# Patient Record
Sex: Female | Born: 1960 | Race: White | Hispanic: No | Marital: Married | Smoking: Current every day smoker
Health system: Southern US, Community
[De-identification: ages and names within clinical notes are randomized; demographics above are authoritative.]

## PROBLEM LIST (undated history)

## (undated) DIAGNOSIS — F419 Anxiety disorder, unspecified: Secondary | ICD-10-CM

## (undated) DIAGNOSIS — M1990 Unspecified osteoarthritis, unspecified site: Secondary | ICD-10-CM

## (undated) DIAGNOSIS — I10 Essential (primary) hypertension: Secondary | ICD-10-CM

## (undated) DIAGNOSIS — C801 Malignant (primary) neoplasm, unspecified: Secondary | ICD-10-CM

## (undated) DIAGNOSIS — F32A Depression, unspecified: Secondary | ICD-10-CM

## (undated) DIAGNOSIS — R112 Nausea with vomiting, unspecified: Secondary | ICD-10-CM

---

## 1998-01-20 HISTORY — PX: COLON SURGERY: SHX602

## 1998-01-20 HISTORY — PX: BREAST SURGERY: SHX581

## 2002-07-04 ENCOUNTER — Ambulatory Visit (HOSPITAL_BASED_OUTPATIENT_CLINIC_OR_DEPARTMENT_OTHER): Admission: RE | Admit: 2002-07-04 | Discharge: 2002-07-05 | Payer: Self-pay | Admitting: Specialist

## 2002-07-04 ENCOUNTER — Encounter (INDEPENDENT_AMBULATORY_CARE_PROVIDER_SITE_OTHER): Payer: Self-pay | Admitting: *Deleted

## 2003-01-21 HISTORY — PX: CHOLECYSTECTOMY: SHX55

## 2003-12-27 ENCOUNTER — Encounter: Admission: RE | Admit: 2003-12-27 | Discharge: 2003-12-27 | Payer: Self-pay | Admitting: *Deleted

## 2003-12-27 ENCOUNTER — Encounter (INDEPENDENT_AMBULATORY_CARE_PROVIDER_SITE_OTHER): Payer: Self-pay | Admitting: *Deleted

## 2004-08-01 ENCOUNTER — Encounter: Admission: RE | Admit: 2004-08-01 | Discharge: 2004-08-01 | Payer: Self-pay | Admitting: *Deleted

## 2005-02-11 ENCOUNTER — Encounter: Admission: RE | Admit: 2005-02-11 | Discharge: 2005-02-11 | Payer: Self-pay | Admitting: *Deleted

## 2005-09-25 ENCOUNTER — Encounter: Admission: RE | Admit: 2005-09-25 | Discharge: 2005-09-25 | Payer: Self-pay | Admitting: Internal Medicine

## 2006-02-17 ENCOUNTER — Encounter: Admission: RE | Admit: 2006-02-17 | Discharge: 2006-02-17 | Payer: Self-pay | Admitting: Internal Medicine

## 2007-03-04 ENCOUNTER — Encounter: Admission: RE | Admit: 2007-03-04 | Discharge: 2007-03-04 | Payer: Self-pay | Admitting: Internal Medicine

## 2008-06-06 ENCOUNTER — Encounter: Admission: RE | Admit: 2008-06-06 | Discharge: 2008-06-06 | Payer: Self-pay | Admitting: *Deleted

## 2009-06-08 ENCOUNTER — Encounter: Admission: RE | Admit: 2009-06-08 | Discharge: 2009-06-08 | Payer: Self-pay | Admitting: Internal Medicine

## 2010-05-30 ENCOUNTER — Other Ambulatory Visit: Payer: Self-pay | Admitting: Family Medicine

## 2010-05-30 DIAGNOSIS — Z1231 Encounter for screening mammogram for malignant neoplasm of breast: Secondary | ICD-10-CM

## 2010-06-07 NOTE — Op Note (Signed)
Kaylee Yang, MAHN NO.:  [id number]   MEDICAL RECORD NO.:  [id number]                   PATIENT TYPE:  AMB   LOCATION:  DSC                                  FACILITY:  [facility]   PHYSICIAN:  Gerald L. Shon Hough, M.D.           DATE OF BIRTH:  May 05, 1960   DATE OF PROCEDURE:  07/04/2002  DATE OF DISCHARGE:                                 OPERATIVE REPORT   SURGEON:  Earvin Hansen L. Shon Hough, M.D.   ASSISTANT:  Alethia Berthold, CFA, CPAO   INDICATIONS FOR PROCEDURE:  A 50 year old lady with severe macromastia, back  and shoulder pain secondary to large, pendulous breasts.  Noncompliant to  conservative treatment with the above symptoms. She has tried medications  and anti-inflammatory agents, talcs, powders, and other creams for  intertriginous changes involving the right and left breast areas with  failure. The patient is now being prepared for bilateral breast reductions.  All procedures and details as well as attendant risks were explained to the  patient preoperatively. The patient understands and consents to surgery.   DESCRIPTION OF PROCEDURE:  The patient was taken to the operating room and  placed on the operating table in the supine position.  Was given adequate  general anesthesia intubated orally.  Preoperatively, the patient was set up  and drawn for the inferior pedicle reduction and mammoplasty.  Remarking the  nipple areolar complexes to 20 cm from the suprasternal notch. After prep  was done with Hibiclens solution and walled off with sterile towels and  drapes so as to make a sterile field, epinephrine solution was injected into  each breast, 500 mL per side.  After waiting the appropriate amount of time  for vasoconstriction to take place, the wounds were scored with #10 blades.  The skin over the inferior pedicle was deepithelialized with #20 blades.  The medial and lateral fatty dermal pedicles were excised down to underlying  fascia.  Hemostasis was maintained with the Bovie unit on coagulation.  Out  laterally more tissue was removed.  The liposuction was fashioned in the  right and left axillary areas over the latissimus dorsi and serratus  anterior areas for removal of more excessive accessory breast tissue.  The  new keyhole area was debulked and flaps were transposed and stayed with 3-0  Prolene. Subcutaneous closure was done with 3-0 Monocryl x2 layers and then  a running subcuticular stitch of 3-0 Monocryl and 5-0 Monocryl throughout  the inverted T. The wounds were drained with #10 Blake drains fully fluted.  They were placed in the depths of the wound and brought out through the  lateral most portion of the incision and secured with 3-0 Prolene. The  wounds were cleansed.  Half inch Steri-Strips and soft dressings were  applied to all of the areas. She withstood the procedures very well and was  taken to the recovery room in excellent condition.  Estimated blood loss was  less than 150 mL.                                               Yaakov Guthrie. Shon Hough, M.D.    Cathie Hoops  D:  07/04/2002  T:  07/04/2002  Job:  [id number]

## 2010-06-21 ENCOUNTER — Ambulatory Visit
Admission: RE | Admit: 2010-06-21 | Discharge: 2010-06-21 | Disposition: A | Discharge status: Home / Self Care | Payer: BC Managed Care – PPO | Source: Ambulatory Visit | Attending: Family Medicine | Admitting: Family Medicine

## 2010-06-21 DIAGNOSIS — Z1231 Encounter for screening mammogram for malignant neoplasm of breast: Secondary | ICD-10-CM

## 2010-09-18 ENCOUNTER — Other Ambulatory Visit: Payer: Self-pay | Admitting: *Deleted

## 2010-09-18 DIAGNOSIS — N63 Unspecified lump in unspecified breast: Secondary | ICD-10-CM

## 2010-09-27 ENCOUNTER — Ambulatory Visit
Admission: RE | Admit: 2010-09-27 | Discharge: 2010-09-27 | Disposition: A | Discharge status: Home / Self Care | Payer: BC Managed Care – PPO | Source: Ambulatory Visit | Attending: *Deleted | Admitting: *Deleted

## 2010-09-27 DIAGNOSIS — N63 Unspecified lump in unspecified breast: Secondary | ICD-10-CM

## 2011-02-21 ENCOUNTER — Other Ambulatory Visit: Payer: Self-pay | Admitting: Family Medicine

## 2011-02-21 DIAGNOSIS — N63 Unspecified lump in unspecified breast: Secondary | ICD-10-CM

## 2011-03-28 ENCOUNTER — Ambulatory Visit
Admission: RE | Admit: 2011-03-28 | Discharge: 2011-03-28 | Disposition: A | Discharge status: Home / Self Care | Payer: BC Managed Care – PPO | Source: Ambulatory Visit | Attending: Family Medicine | Admitting: Family Medicine

## 2011-03-28 DIAGNOSIS — N63 Unspecified lump in unspecified breast: Secondary | ICD-10-CM

## 2011-08-19 ENCOUNTER — Other Ambulatory Visit: Payer: Self-pay | Admitting: Family Medicine

## 2011-08-19 DIAGNOSIS — D249 Benign neoplasm of unspecified breast: Secondary | ICD-10-CM

## 2011-10-10 ENCOUNTER — Other Ambulatory Visit: Payer: BC Managed Care – PPO

## 2011-10-24 ENCOUNTER — Ambulatory Visit
Admission: RE | Admit: 2011-10-24 | Discharge: 2011-10-24 | Disposition: A | Discharge status: Home / Self Care | Payer: BC Managed Care – PPO | Source: Ambulatory Visit | Attending: Family Medicine | Admitting: Family Medicine

## 2011-10-24 DIAGNOSIS — D249 Benign neoplasm of unspecified breast: Secondary | ICD-10-CM

## 2012-05-10 ENCOUNTER — Other Ambulatory Visit: Payer: Self-pay | Admitting: Family Medicine

## 2012-05-10 DIAGNOSIS — N6320 Unspecified lump in the left breast, unspecified quadrant: Secondary | ICD-10-CM

## 2012-11-23 ENCOUNTER — Encounter: Payer: Self-pay | Admitting: Advanced Practice Midwife

## 2012-11-23 VITALS — BP 114/80 | Ht 63.0 in

## 2013-02-10 ENCOUNTER — Other Ambulatory Visit: Payer: Self-pay | Admitting: Family

## 2013-02-10 DIAGNOSIS — N6320 Unspecified lump in the left breast, unspecified quadrant: Secondary | ICD-10-CM

## 2013-02-18 ENCOUNTER — Other Ambulatory Visit: Payer: BC Managed Care – PPO

## 2013-02-25 ENCOUNTER — Ambulatory Visit
Admission: RE | Admit: 2013-02-25 | Discharge: 2013-02-25 | Disposition: A | Discharge status: Home / Self Care | Payer: Commercial Managed Care - PPO | Source: Ambulatory Visit | Attending: Family | Admitting: Family

## 2013-02-25 DIAGNOSIS — N6320 Unspecified lump in the left breast, unspecified quadrant: Secondary | ICD-10-CM

## 2014-01-30 ENCOUNTER — Other Ambulatory Visit: Payer: Self-pay | Admitting: Family

## 2014-01-30 ENCOUNTER — Other Ambulatory Visit: Payer: Self-pay

## 2014-01-30 DIAGNOSIS — Z1231 Encounter for screening mammogram for malignant neoplasm of breast: Secondary | ICD-10-CM

## 2014-03-03 ENCOUNTER — Ambulatory Visit
Admission: RE | Admit: 2014-03-03 | Discharge: 2014-03-03 | Disposition: A | Discharge status: Home / Self Care | Payer: Commercial Managed Care - PPO | Source: Ambulatory Visit

## 2014-03-03 ENCOUNTER — Encounter (INDEPENDENT_AMBULATORY_CARE_PROVIDER_SITE_OTHER): Payer: Self-pay

## 2014-03-03 DIAGNOSIS — Z1231 Encounter for screening mammogram for malignant neoplasm of breast: Secondary | ICD-10-CM

## 2015-05-02 ENCOUNTER — Other Ambulatory Visit: Payer: Self-pay

## 2015-05-02 DIAGNOSIS — Z1231 Encounter for screening mammogram for malignant neoplasm of breast: Secondary | ICD-10-CM

## 2015-05-25 ENCOUNTER — Ambulatory Visit: Payer: Commercial Managed Care - PPO

## 2015-05-25 ENCOUNTER — Ambulatory Visit
Admission: RE | Admit: 2015-05-25 | Discharge: 2015-05-25 | Disposition: A | Discharge status: Home / Self Care | Payer: Commercial Managed Care - PPO | Source: Ambulatory Visit

## 2015-05-25 DIAGNOSIS — Z1231 Encounter for screening mammogram for malignant neoplasm of breast: Secondary | ICD-10-CM

## 2015-05-28 ENCOUNTER — Other Ambulatory Visit: Payer: Self-pay | Admitting: Obstetrics and Gynecology

## 2015-05-28 DIAGNOSIS — N63 Unspecified lump in unspecified breast: Secondary | ICD-10-CM

## 2015-06-01 ENCOUNTER — Ambulatory Visit
Admission: RE | Admit: 2015-06-01 | Discharge: 2015-06-01 | Disposition: A | Discharge status: Home / Self Care | Payer: Commercial Managed Care - PPO | Source: Ambulatory Visit | Attending: Obstetrics and Gynecology | Admitting: Obstetrics and Gynecology

## 2015-06-01 DIAGNOSIS — N63 Unspecified lump in unspecified breast: Secondary | ICD-10-CM

## 2015-10-29 ENCOUNTER — Other Ambulatory Visit: Payer: Self-pay | Admitting: Obstetrics and Gynecology

## 2015-10-29 DIAGNOSIS — N6320 Unspecified lump in the left breast, unspecified quadrant: Secondary | ICD-10-CM

## 2015-12-07 ENCOUNTER — Other Ambulatory Visit: Payer: Commercial Managed Care - PPO

## 2015-12-26 ENCOUNTER — Ambulatory Visit: Payer: Commercial Managed Care - PPO | Admitting: Gastroenterology

## 2016-01-04 ENCOUNTER — Ambulatory Visit
Admission: RE | Admit: 2016-01-04 | Discharge: 2016-01-04 | Disposition: A | Discharge status: Home / Self Care | Payer: Commercial Managed Care - PPO | Source: Ambulatory Visit | Attending: Obstetrics and Gynecology | Admitting: Obstetrics and Gynecology

## 2016-01-04 DIAGNOSIS — N6320 Unspecified lump in the left breast, unspecified quadrant: Secondary | ICD-10-CM

## 2017-12-22 ENCOUNTER — Other Ambulatory Visit: Payer: Self-pay | Admitting: Obstetrics and Gynecology

## 2017-12-22 ENCOUNTER — Other Ambulatory Visit: Payer: Self-pay | Admitting: Family

## 2017-12-22 DIAGNOSIS — Z1231 Encounter for screening mammogram for malignant neoplasm of breast: Secondary | ICD-10-CM

## 2017-12-22 DIAGNOSIS — N6320 Unspecified lump in the left breast, unspecified quadrant: Secondary | ICD-10-CM

## 2018-02-12 ENCOUNTER — Other Ambulatory Visit: Payer: Commercial Managed Care - PPO

## 2018-04-02 ENCOUNTER — Other Ambulatory Visit: Payer: Commercial Managed Care - PPO

## 2019-08-18 ENCOUNTER — Other Ambulatory Visit: Payer: Self-pay | Admitting: Adult Health

## 2019-12-23 ENCOUNTER — Other Ambulatory Visit: Payer: Self-pay | Admitting: Obstetrics and Gynecology

## 2019-12-23 DIAGNOSIS — N63 Unspecified lump in unspecified breast: Secondary | ICD-10-CM

## 2020-01-30 ENCOUNTER — Other Ambulatory Visit: Payer: Self-pay

## 2020-02-23 ENCOUNTER — Other Ambulatory Visit: Payer: Self-pay

## 2020-05-15 ENCOUNTER — Other Ambulatory Visit: Payer: Self-pay

## 2020-08-29 ENCOUNTER — Other Ambulatory Visit: Payer: Self-pay | Admitting: Obstetrics and Gynecology

## 2020-08-29 ENCOUNTER — Ambulatory Visit (HOSPITAL_COMMUNITY)
Admission: RE | Admit: 2020-08-29 | Discharge: 2020-08-29 | Disposition: A | Discharge status: Home / Self Care | Payer: BC Managed Care – PPO | Source: Ambulatory Visit | Attending: Obstetrics and Gynecology | Admitting: Obstetrics and Gynecology

## 2020-08-29 ENCOUNTER — Ambulatory Visit
Admission: RE | Admit: 2020-08-29 | Discharge: 2020-08-29 | Disposition: A | Discharge status: Home / Self Care | Payer: BC Managed Care – PPO | Source: Ambulatory Visit | Attending: Obstetrics and Gynecology | Admitting: Obstetrics and Gynecology

## 2020-08-29 ENCOUNTER — Other Ambulatory Visit: Payer: Self-pay

## 2020-08-29 DIAGNOSIS — N63 Unspecified lump in unspecified breast: Secondary | ICD-10-CM

## 2020-08-29 DIAGNOSIS — R928 Other abnormal and inconclusive findings on diagnostic imaging of breast: Secondary | ICD-10-CM

## 2020-09-06 ENCOUNTER — Ambulatory Visit
Admission: RE | Admit: 2020-09-06 | Discharge: 2020-09-06 | Disposition: A | Discharge status: Home / Self Care | Payer: BC Managed Care – PPO | Source: Ambulatory Visit | Attending: Obstetrics and Gynecology | Admitting: Obstetrics and Gynecology

## 2020-09-06 ENCOUNTER — Other Ambulatory Visit: Payer: Self-pay

## 2020-09-06 DIAGNOSIS — N63 Unspecified lump in unspecified breast: Secondary | ICD-10-CM

## 2020-09-07 ENCOUNTER — Other Ambulatory Visit: Payer: Self-pay | Admitting: Obstetrics and Gynecology

## 2020-09-07 DIAGNOSIS — D242 Benign neoplasm of left breast: Secondary | ICD-10-CM

## 2020-09-14 ENCOUNTER — Ambulatory Visit
Admission: RE | Admit: 2020-09-14 | Discharge: 2020-09-14 | Disposition: A | Discharge status: Home / Self Care | Payer: BC Managed Care – PPO | Source: Ambulatory Visit | Attending: Obstetrics and Gynecology | Admitting: Obstetrics and Gynecology

## 2020-09-14 ENCOUNTER — Other Ambulatory Visit: Payer: Self-pay

## 2020-09-14 DIAGNOSIS — D242 Benign neoplasm of left breast: Secondary | ICD-10-CM

## 2020-09-27 ENCOUNTER — Other Ambulatory Visit: Payer: Self-pay | Admitting: Surgery

## 2020-10-17 ENCOUNTER — Other Ambulatory Visit: Payer: Self-pay

## 2020-10-17 ENCOUNTER — Encounter (HOSPITAL_BASED_OUTPATIENT_CLINIC_OR_DEPARTMENT_OTHER): Payer: Self-pay | Admitting: Surgery

## 2020-10-17 NOTE — Progress Notes (Signed)
Spoke w/ via phone for pre-op interview--- [facility] needs dos----  EKG, ISTAT             Lab results------ COVID test -----patient states asymptomatic no test needed Arrive at ------- NPO after MN NO Solid Food.  Clear liquids from MN until--- Med rec completed Medications to take morning of surgery ----- Prozac Diabetic medication ----- Patient instructed no nail polish to be worn day of surgery Patient instructed to bring photo id and insurance card day of surgery Patient aware to have Driver (ride ) / caregiver    for 24 hours after surgery  Patient Special Instructions ----- Pre-Op special Istructions ----- Patient verbalized understanding of instructions that were given at this phone interview. Patient denies shortness of breath, chest pain, fever, cough at this phone interview.

## 2020-10-23 NOTE — H&P (Signed)
REFERRING PHYSICIAN:  Lovenia Kim, MD   PROVIDER:  Beverlee Nims, MD   MRN: [id number] DOB: January 11, 1961    Subjective    Chief Complaint: Breast Problem (Left Breast )       History of Present Illness: Kaylee Yang is a 60 y.o. female who is seen today as an office consultation at the request of Dr. Ronita Hipps for evaluation of Breast Problem (Left Breast ) .     She is referred here for evaluation of a mass in the left breast.  She has had a mass since at least 2017.  She recently felt has been getting slightly bigger.  She underwent a mammogram and ultrasound showing the mass measuring 1.7 cm.  It was biopsied showing an intraductal papilloma.  She has had previous bilateral breast reduction.  She has a significant family history of breast cancer and a maternal aunt and a cousin.  She has no cardiopulmonary issues and is otherwise been doing well.     Review of Systems: A complete review of systems was obtained from the patient.  I have reviewed this information and discussed as appropriate with the patient.  See HPI as well for other ROS.   ROS      Medical History: Past Medical History      Past Medical History:  Diagnosis Date   Anxiety     Depression     Diverticulitis     Edema      occassional   Hyperlipidemia     Hypertension     Morbid obesity with BMI of 40.0-44.9, adult (CMS-HCC)     Osteoarthritis     PONV (postoperative nausea and vomiting)             Patient Active Problem List  Diagnosis   Obesity   HTN (hypertension)   Morbid obesity with BMI of 40.0-44.9, adult (CMS-HCC)   PONV (postoperative nausea and vomiting)   Osteoarthritis   Hypertension   Hyperlipidemia   Anxiety state   Cervical radiculopathy   Chronic neck pain   Chronic pain syndrome   Degenerative disc disease, cervical   Depressive disorder   Erosive osteoarthritis of both hands   History of fusion of cervical spine   History of gastric surgery   Long-term use  of Plaquenil   Polyarthralgia   Primary osteoarthritis involving multiple joints   Primary osteoarthritis of both knees   Tobacco use disorder   Vitamin D deficiency   Essential hypertension   Hyperlipidemia   Morbid obesity due to excess calories (CMS-HCC)   Arthritis      Past Surgical History       Past Surgical History:  Procedure Laterality Date   ARTHRODESIS ANTERIOR CERVICLE SPINE N/A 07/26/2019    Procedure: Anterior cervical discectomy and fusion Cervical 5-6, C6-7 with Vertigraft VG1 allograft and nuvasive Archon plating;  Surgeon: Redge Gainer, MD;  Location: [place];  Service: Neurosurgery;  Laterality: N/A;   ARTHRODESIS ANTERIOR CERVICLE SPINE N/A 07/26/2019    Procedure: ARTHRODESIS ANT INTERBODY INC DISCECTOMY, CERVICAL EACH ADDL;  Surgeon: Redge Gainer, MD;  Location: [place];  Service: Neurosurgery;  Laterality: N/A;   breast surgery Bilateral      lumpectomy- bilateral,  both negative   BREAST SURGERY       colon resection        COLON SURGERY        diverticulitis   COLONOSCOPY       elbow surgery  INSERTION STRUCTURAL BONE ALLOGRAFT FOR SPINE SURGERY N/A 07/26/2019    Procedure: INSERTION STRUCTURAL BONE ALLOGRAFT FOR SPINE SURGERY;  Surgeon: Redge Gainer, MD;  Location: [place];  Service: Neurosurgery;  Laterality: N/A;   INSTRUMENTATION ANTERIOR SPINE 4 TO 7 SEGMENTS N/A 07/26/2019    Procedure: INSTRUMENTATION ANTERIOR SPINE 2 TO 3 VERTEBRAL SEGMENTS;  Surgeon: Redge Gainer, MD;  Location: [place];  Service: Neurosurgery;  Laterality: N/A;        Allergies      Allergies  Allergen Reactions   Codeine Nausea and Rash              Current Outpatient Medications on File Prior to Visit  Medication Sig Dispense Refill   cyanocobalamin (VITAMIN B12) 1,000 mcg/mL injection Inject 1,000 mcg into the muscle monthly Around the 15th         cyclobenzaprine (FLEXERIL) 10 MG tablet Take 10 mg by mouth 3 (three) times daily as needed for  Muscle spasms       ezetimibe (ZETIA) 10 mg tablet TAKE ONE TABLET BY MOUTH NIGHTLY ALONG WITH SIMVASTATIN       FLUoxetine (PROZAC) 20 MG capsule TAKE THREE CAPSULES EVERY DAY       lisinopril-hydrochlorothiazide (PRINZIDE,ZESTORETIC) 20-25 mg tablet Take 1 tablet by mouth daily.       simvastatin (ZOCOR) 10 MG tablet TAKE ONE TABLET BY MOUTH NIGHTLY ALONG WITH EZETIMIBE        No current facility-administered medications on file prior to visit.      Family History       Family History  Problem Relation Age of Onset   Heart disease Mother     Arthritis Mother     Thyroid disease Mother     Hyperlipidemia (Elevated cholesterol) Mother     Bladder Cancer Father     Heart disease Father     Myocardial Infarction (Heart attack) Father     Diabetes Father          Social History        Tobacco Use  Smoking Status Current Every Day Smoker   Packs/day: 2.00   Years: 38.00   Pack years: 76.00   Types: Cigarettes  Smokeless Tobacco Never Used  Tobacco Comment    1-3 packs per week      Social History  Social History         Socioeconomic History   Marital status: Married  Tobacco Use   Smoking status: Current Every Day Smoker      Packs/day: 2.00      Years: 38.00      Pack years: 76.00      Types: Cigarettes   Smokeless tobacco: Never Used   Tobacco comment: 1-3 packs per week  Vaping Use   Vaping Use: Never used  Substance and Sexual Activity   Alcohol use: Never      Comment: Does not drink alcohol   Drug use: Never        Objective:         Vitals:      BP: 124/82  Pulse: 90  Temp: 36.7 C (98 F)  SpO2: 95%  Weight: (!) 111.1 kg (245 lb)  Height: 158.8 cm (5' 2.5")    Body mass index is 44.1 kg/m.   Physical Exam    She appears well on exam   There is no axillary adenopathy   There is a small mass that is just below the areola at the 6  o'clock position of the left breast.  It is easily palpable.  There are no other  abnormalities.  Lungs clear  CV RRR  Abdomen soft, NT  Skin without erythema     Labs, Imaging and Diagnostic Testing: I reviewed her mammogram, ultrasound, and pathology results   Assessment and Plan:  Diagnoses and all orders for this visit:   Intraductal papilloma of breast, left       I had a long discussion with the patient and her husband regarding the pathology findings.  Again I have reviewed her mammograms, ultrasound, pathology results and I gave her a copy of the pathology report.  Left breast lumpectomy is recommended to completely remove the papilloma for complete histologic evaluation to rule out malignancy.  We discussed the reasons for this in detail.  I discussed the surgical procedure in detail.  We discussed the risk which includes but is not limited to bleeding, infection, injury to surrounding structures, the need for further procedures if malignancy is found, postoperative recovery, etc.  They understand and wish to proceed with surgery which will be scheduled.

## 2020-10-24 ENCOUNTER — Encounter (HOSPITAL_BASED_OUTPATIENT_CLINIC_OR_DEPARTMENT_OTHER)
Admission: RE | Disposition: A | Discharge status: Home / Self Care | Payer: Self-pay | Source: Home / Self Care | Attending: Surgery

## 2020-10-24 ENCOUNTER — Ambulatory Visit (HOSPITAL_BASED_OUTPATIENT_CLINIC_OR_DEPARTMENT_OTHER): Payer: BC Managed Care – PPO | Admitting: Anesthesiology

## 2020-10-24 ENCOUNTER — Ambulatory Visit (HOSPITAL_BASED_OUTPATIENT_CLINIC_OR_DEPARTMENT_OTHER)
Admission: RE | Admit: 2020-10-24 | Discharge: 2020-10-24 | Disposition: A | Discharge status: Home / Self Care | Payer: BC Managed Care – PPO | Attending: Surgery | Admitting: Surgery

## 2020-10-24 ENCOUNTER — Encounter (HOSPITAL_BASED_OUTPATIENT_CLINIC_OR_DEPARTMENT_OTHER): Payer: Self-pay | Admitting: Surgery

## 2020-10-24 ENCOUNTER — Other Ambulatory Visit: Payer: Self-pay

## 2020-10-24 DIAGNOSIS — Z8052 Family history of malignant neoplasm of bladder: Secondary | ICD-10-CM | POA: Diagnosis not present

## 2020-10-24 DIAGNOSIS — Z885 Allergy status to narcotic agent status: Secondary | ICD-10-CM | POA: Insufficient documentation

## 2020-10-24 DIAGNOSIS — Z8249 Family history of ischemic heart disease and other diseases of the circulatory system: Secondary | ICD-10-CM | POA: Insufficient documentation

## 2020-10-24 DIAGNOSIS — Z8349 Family history of other endocrine, nutritional and metabolic diseases: Secondary | ICD-10-CM | POA: Diagnosis not present

## 2020-10-24 DIAGNOSIS — Z79899 Other long term (current) drug therapy: Secondary | ICD-10-CM | POA: Diagnosis not present

## 2020-10-24 DIAGNOSIS — D242 Benign neoplasm of left breast: Secondary | ICD-10-CM | POA: Diagnosis not present

## 2020-10-24 DIAGNOSIS — Z6841 Body Mass Index (BMI) 40.0 and over, adult: Secondary | ICD-10-CM | POA: Diagnosis not present

## 2020-10-24 DIAGNOSIS — F17210 Nicotine dependence, cigarettes, uncomplicated: Secondary | ICD-10-CM | POA: Diagnosis not present

## 2020-10-24 DIAGNOSIS — Z833 Family history of diabetes mellitus: Secondary | ICD-10-CM | POA: Diagnosis not present

## 2020-10-24 DIAGNOSIS — Z803 Family history of malignant neoplasm of breast: Secondary | ICD-10-CM | POA: Diagnosis not present

## 2020-10-24 HISTORY — DX: Essential (primary) hypertension: I10

## 2020-10-24 HISTORY — DX: Malignant (primary) neoplasm, unspecified: C80.1

## 2020-10-24 HISTORY — PX: BREAST LUMPECTOMY: SHX2

## 2020-10-24 HISTORY — DX: Unspecified osteoarthritis, unspecified site: M19.90

## 2020-10-24 HISTORY — DX: Anxiety disorder, unspecified: F41.9

## 2020-10-24 HISTORY — DX: Nausea with vomiting, unspecified: R11.2

## 2020-10-24 HISTORY — DX: Depression, unspecified: F32.A

## 2020-10-24 LAB — POCT I-STAT, CHEM 8
BUN: 18 mg/dL (ref 6–20)
Calcium, Ion: 1.26 mmol/L (ref 1.15–1.40)
Chloride: 105 mmol/L (ref 98–111)
Creatinine, Ser: 0.7 mg/dL (ref 0.44–1.00)
Glucose, Bld: 92 mg/dL (ref 70–99)
HCT: 38 % (ref 36.0–46.0)
Hemoglobin: 12.9 g/dL (ref 12.0–15.0)
Potassium: 3.9 mmol/L (ref 3.5–5.1)
Sodium: 142 mmol/L (ref 135–145)
TCO2: 26 mmol/L (ref 22–32)

## 2020-10-24 SURGERY — BREAST LUMPECTOMY
Anesthesia: General | Site: Breast | Laterality: Left

## 2020-10-24 MED ORDER — CEFAZOLIN SODIUM-DEXTROSE 2-4 GM/100ML-% IV SOLN
INTRAVENOUS | Status: AC
  Filled 2020-10-24: qty 100

## 2020-10-24 MED ORDER — TRAMADOL HCL 50 MG PO TABS: 50.0000 mg | ORAL_TABLET | Freq: Four times a day (QID) | ORAL | 0 refills | Status: AC | PRN

## 2020-10-24 MED ORDER — LIDOCAINE 2% (20 MG/ML) 5 ML SYRINGE
INTRAMUSCULAR | Status: AC
  Filled 2020-10-24: qty 5

## 2020-10-24 MED ORDER — BUPIVACAINE-EPINEPHRINE 0.5% -1:200000 IJ SOLN
INTRAMUSCULAR | Status: DC | PRN
  Administered 2020-10-24: 10 mL

## 2020-10-24 MED ORDER — FENTANYL CITRATE (PF) 100 MCG/2ML IJ SOLN
INTRAMUSCULAR | Status: AC
  Filled 2020-10-24: qty 2

## 2020-10-24 MED ORDER — FENTANYL CITRATE (PF) 100 MCG/2ML IJ SOLN: 25.0000 ug | INTRAMUSCULAR | Status: DC | PRN

## 2020-10-24 MED ORDER — CHLORHEXIDINE GLUCONATE CLOTH 2 % EX PADS: 6.0000 | MEDICATED_PAD | Freq: Once | CUTANEOUS | Status: DC

## 2020-10-24 MED ORDER — PROPOFOL 10 MG/ML IV BOLUS
INTRAVENOUS | Status: DC | PRN
Start: 2020-10-24 — End: 2020-10-24
  Administered 2020-10-24: 200 mg via INTRAVENOUS

## 2020-10-24 MED ORDER — PROPOFOL 10 MG/ML IV BOLUS
INTRAVENOUS | Status: AC
  Filled 2020-10-24: qty 20

## 2020-10-24 MED ORDER — MIDAZOLAM HCL 5 MG/5ML IJ SOLN
INTRAMUSCULAR | Status: DC | PRN
  Administered 2020-10-24: 2 mg via INTRAVENOUS

## 2020-10-24 MED ORDER — DEXAMETHASONE SODIUM PHOSPHATE 10 MG/ML IJ SOLN
INTRAMUSCULAR | Status: AC
  Filled 2020-10-24: qty 1

## 2020-10-24 MED ORDER — CEFAZOLIN SODIUM-DEXTROSE 2-4 GM/100ML-% IV SOLN
2.0000 g | INTRAVENOUS | Status: AC
  Administered 2020-10-24: 2 g via INTRAVENOUS

## 2020-10-24 MED ORDER — KETOROLAC TROMETHAMINE 30 MG/ML IJ SOLN: 30.0000 mg | Freq: Once | INTRAMUSCULAR | Status: DC | PRN

## 2020-10-24 MED ORDER — MIDAZOLAM HCL 2 MG/2ML IJ SOLN
INTRAMUSCULAR | Status: AC
  Filled 2020-10-24: qty 2

## 2020-10-24 MED ORDER — DEXAMETHASONE SODIUM PHOSPHATE 4 MG/ML IJ SOLN
INTRAMUSCULAR | Status: DC | PRN
  Administered 2020-10-24: 10 mg via INTRAVENOUS

## 2020-10-24 MED ORDER — ONDANSETRON HCL 4 MG/2ML IJ SOLN
INTRAMUSCULAR | Status: AC
  Filled 2020-10-24: qty 2

## 2020-10-24 MED ORDER — LIDOCAINE 2% (20 MG/ML) 5 ML SYRINGE
INTRAMUSCULAR | Status: DC | PRN
  Administered 2020-10-24: 100 mg via INTRAVENOUS

## 2020-10-24 MED ORDER — LACTATED RINGERS IV SOLN: INTRAVENOUS | Status: DC

## 2020-10-24 MED ORDER — KETOROLAC TROMETHAMINE 30 MG/ML IJ SOLN
INTRAMUSCULAR | Status: AC
  Filled 2020-10-24: qty 1

## 2020-10-24 MED ORDER — FENTANYL CITRATE (PF) 100 MCG/2ML IJ SOLN
INTRAMUSCULAR | Status: DC | PRN
  Administered 2020-10-24 (×2): 50 ug via INTRAVENOUS

## 2020-10-24 MED ORDER — ONDANSETRON HCL 4 MG/2ML IJ SOLN: 4.0000 mg | Freq: Once | INTRAMUSCULAR | Status: DC | PRN

## 2020-10-24 MED ORDER — ONDANSETRON HCL 4 MG/2ML IJ SOLN
INTRAMUSCULAR | Status: DC | PRN
  Administered 2020-10-24: 4 mg via INTRAVENOUS

## 2020-10-24 MED ORDER — 0.9 % SODIUM CHLORIDE (POUR BTL) OPTIME
TOPICAL | Status: DC | PRN
Start: 2020-10-24 — End: 2020-10-24
  Administered 2020-10-24: 500 mL

## 2020-10-24 SURGICAL SUPPLY — 34 items
ADH SKN CLS APL DERMABOND .7 (GAUZE/BANDAGES/DRESSINGS) ×1
APL PRP STRL LF DISP 70% ISPRP (MISCELLANEOUS) ×1
BLADE SURG 15 STRL LF DISP TIS (BLADE) ×1 IMPLANT
BLADE SURG 15 STRL SS (BLADE) ×2
CHLORAPREP W/TINT 26 (MISCELLANEOUS) ×2 IMPLANT
COVER BACK TABLE 60X90IN (DRAPES) ×2 IMPLANT
COVER MAYO STAND STRL (DRAPES) ×2 IMPLANT
DERMABOND ADVANCED (GAUZE/BANDAGES/DRESSINGS) ×1
DERMABOND ADVANCED .7 DNX12 (GAUZE/BANDAGES/DRESSINGS) ×1 IMPLANT
DRAPE LAPAROTOMY 100X72 PEDS (DRAPES) ×2 IMPLANT
DRAPE UTILITY XL STRL (DRAPES) ×2 IMPLANT
ELECT REM PT RETURN 9FT ADLT (ELECTROSURGICAL) ×2
ELECTRODE REM PT RTRN 9FT ADLT (ELECTROSURGICAL) ×1 IMPLANT
GLOVE SURG POLYISO LF SZ7 (GLOVE) ×1 IMPLANT
GLOVE SURG SIGNA 7.5 PF LTX (GLOVE) ×2 IMPLANT
GLOVE SURG UNDER POLY LF SZ7 (GLOVE) ×2 IMPLANT
GLOVE SURG UNDER POLY LF SZ7.5 (GLOVE) ×1 IMPLANT
GOWN STRL REUS W/ TWL LRG LVL3 (GOWN DISPOSABLE) ×1 IMPLANT
GOWN STRL REUS W/ TWL XL LVL3 (GOWN DISPOSABLE) ×1 IMPLANT
GOWN STRL REUS W/TWL LRG LVL3 (GOWN DISPOSABLE) ×3 IMPLANT
GOWN STRL REUS W/TWL XL LVL3 (GOWN DISPOSABLE) ×2
NDL HYPO 25X1 1.5 SAFETY (NEEDLE) ×1 IMPLANT
NEEDLE HYPO 25X1 1.5 SAFETY (NEEDLE) ×2 IMPLANT
NS IRRIG 500ML POUR BTL (IV SOLUTION) ×1 IMPLANT
PACK BASIN DAY SURGERY FS (CUSTOM PROCEDURE TRAY) ×2 IMPLANT
PENCIL SMOKE EVACUATOR (MISCELLANEOUS) ×2 IMPLANT
SLEEVE SCD COMPRESS KNEE MED (STOCKING) ×1 IMPLANT
SPONGE T-LAP 4X18 ~~LOC~~+RFID (SPONGE) ×2 IMPLANT
SUT MNCRL AB 4-0 PS2 18 (SUTURE) ×2 IMPLANT
SUT SILK 2 0 SH (SUTURE) ×2 IMPLANT
SUT VIC AB 3-0 SH 27 (SUTURE) ×2
SUT VIC AB 3-0 SH 27X BRD (SUTURE) ×1 IMPLANT
SYR CONTROL 10ML LL (SYRINGE) ×2 IMPLANT
TOWEL OR 17X26 10 PK STRL BLUE (TOWEL DISPOSABLE) ×2 IMPLANT

## 2020-10-24 NOTE — Op Note (Signed)
LEFT BREAST LUMPECTOMY  Procedure Note  Kaylee Yang 10/24/2020   Pre-op Diagnosis: LEFT BREAST PAPILLOMA     Post-op Diagnosis: same  Procedure(s): LEFT BREAST LUMPECTOMY  Surgeon(s): Coralie Keens, MD  Anesthesia: General  Staff:  Circulator: [place] Letta Pate, RN Scrub Person: Jason Fila, Lannette Donath I  Estimated Blood Loss: Minimal               Specimens: sent to path  Indications: This is a 60 year old female was found to have a small mass that was subareolar in the left breast.  She underwent a stereotactic biopsy showing intraductal papilloma.  The decision was made to proceed with a left breast lumpectomy  Procedure: Patient was brought to operating identifies correct patient.  She is placed upon the operating table general anesthesia was induced.  Her left breast was prepped and draped in usual sterile fashion.  The palpable mass was located beneath the areola at the 6 clock position.  I anesthetized the skin of the lower edge of the areola with Marcaine and then made a circumareolar incision through previous scar with a scalpel.  I then dissected down to the breast tissue with electrocautery.  The mass was superficial and easily palpable.  I excised it in its entirety and sent to pathology for evaluation.  I anesthetized the incision further with Marcaine.  I achieved hemostasis with the cautery.  I then closed the subcutaneous tissue with interrupted 3-0 Vicryl sutures and closed the skin with a running 4-0 Monocryl.  Dermabond was then applied.  The patient tolerated the procedure well.  All the counts were correct at the end of the procedure.  The patient was then extubated in the operating room and taken in a stable condition to the recovery room.          Coralie Keens   Date: 10/24/2020  Time: 8:53 AM

## 2020-10-24 NOTE — Anesthesia Procedure Notes (Signed)
Procedure Name: LMA Insertion Date/Time: 10/24/2020 8:31 AM Performed by: Justice Rocher, CRNA Pre-anesthesia Checklist: Patient identified, Emergency Drugs available, Suction available, Patient being monitored and Timeout performed Patient Re-evaluated:Patient Re-evaluated prior to induction Oxygen Delivery Method: Circle system utilized Preoxygenation: Pre-oxygenation with 100% oxygen Induction Type: IV induction Ventilation: Mask ventilation without difficulty LMA: LMA inserted LMA Size: 4.0 Number of attempts: 1 Airway Equipment and Method: Bite block Placement Confirmation: positive ETCO2, breath sounds checked- equal and bilateral and CO2 detector Tube secured with: Tape Dental Injury: Teeth and Oropharynx as per pre-operative assessment

## 2020-10-24 NOTE — Anesthesia Postprocedure Evaluation (Signed)
Anesthesia Post Note  Patient: Kaylee Yang  Procedure(s) Performed: LEFT BREAST LUMPECTOMY (Left: Breast)     Patient location during evaluation: PACU Anesthesia Type: General Level of consciousness: awake and alert Pain management: pain level controlled Vital Signs Assessment: post-procedure vital signs reviewed and stable Respiratory status: spontaneous breathing, nonlabored ventilation, respiratory function stable and patient connected to nasal cannula oxygen Cardiovascular status: blood pressure returned to baseline and stable Postop Assessment: no apparent nausea or vomiting Anesthetic complications: no   No notable events documented.  Last Vitals:  Vitals:   10/24/20 0910 10/24/20 0915  BP:  127/68  Pulse: 76 69  Resp: 13 18  Temp:    SpO2: 100% 97%    Last Pain:  Vitals:   10/24/20 0905  TempSrc:   PainSc: 0-No pain                 Zeplin Aleshire S

## 2020-10-24 NOTE — Discharge Instructions (Addendum)
[place] Office Phone Number [phone]  BREAST BIOPSY/ PARTIAL MASTECTOMY: POST OP INSTRUCTIONS  Always review your discharge instruction sheet given to you by the facility where your surgery was performed.  IF YOU HAVE DISABILITY OR FAMILY LEAVE FORMS, YOU MUST BRING THEM TO THE OFFICE FOR PROCESSING.  DO NOT GIVE THEM TO YOUR DOCTOR.  A prescription for pain medication may be given to you upon discharge.  Take your pain medication as prescribed, if needed.  If narcotic pain medicine is not needed, then you may take acetaminophen (Tylenol) or ibuprofen (Advil) as needed. Take your usually prescribed medications unless otherwise directed If you need a refill on your pain medication, please contact your pharmacy.  They will contact our office to request authorization.  Prescriptions will not be filled after 5pm or on week-ends. You should eat very light the first 24 hours after surgery, such as soup, crackers, pudding, etc.  Resume your normal diet the day after surgery. Most patients will experience some swelling and bruising in the breast.  Ice packs and a good support bra will help.  Swelling and bruising can take several days to resolve.  It is common to experience some constipation if taking pain medication after surgery.  Increasing fluid intake and taking a stool softener will usually help or prevent this problem from occurring.  A mild laxative (Milk of Magnesia or Miralax) should be taken according to package directions if there are no bowel movements after 48 hours. Unless discharge instructions indicate otherwise, you may remove your bandages 24-48 hours after surgery, and you may shower at that time.  You may have steri-strips (small skin tapes) in place directly over the incision.  These strips should be left on the skin for 7-10 days.  If your surgeon used skin glue on the incision, you may shower in 24 hours.  The glue will flake off over the next 2-3 weeks.  Any  sutures or staples will be removed at the office during your follow-up visit. ACTIVITIES:  You may resume regular daily activities (gradually increasing) beginning the next day.  Wearing a good support bra or sports bra minimizes pain and swelling.  You may have sexual intercourse when it is comfortable. You may drive when you no longer are taking prescription pain medication, you can comfortably wear a seatbelt, and you can safely maneuver your car and apply brakes. RETURN TO WORK:  ______________________________________________________________________________________ Dennis Bast should see your doctor in the office for a follow-up appointment approximately two weeks after your surgery.  Your doctor's nurse will typically make your follow-up appointment when she calls you with your pathology report.  Expect your pathology report 2-3 business days after your surgery.  You may call to check if you do not hear from [place] after three days. OTHER INSTRUCTIONS: YOU MAY SHOWER STARTING TOMORROW ICE PACK, TYLENOL, AND IBUPROFEN ALSO FOR PAIN NO VIGOROUS ACTIVITY FOR 1 WEEK _______________________________________________________________________________________________ _____________________________________________________________________________________________________________________________________ _____________________________________________________________________________________________________________________________________ _____________________________________________________________________________________________________________________________________  WHEN TO CALL YOUR DOCTOR: Fever over 101.0 Nausea and/or vomiting. Extreme swelling or bruising. Continued bleeding from incision. Increased pain, redness, or drainage from the incision.  The clinic staff is available to answer your questions during regular business hours.  Please don't hesitate to call and ask to speak to one of the nurses for clinical  concerns.  If you have a medical emergency, go to the nearest emergency room or call 911.  A surgeon from [facility] is always on call at the hospital.  For further questions, please visit  [email or web address]        Post Anesthesia Home Care Instructions  Activity: Get plenty of rest for the remainder of the day. A responsible individual must stay with you for 24 hours following the procedure.  For the next 24 hours, DO NOT: -Drive a car -Paediatric nurse -Drink alcoholic beverages -Take any medication unless instructed by your physician -Make any legal decisions or sign important papers.  Meals: Start with liquid foods such as gelatin or soup. Progress to regular foods as tolerated. Avoid greasy, spicy, heavy foods. If nausea and/or vomiting occur, drink only clear liquids until the nausea and/or vomiting subsides. Call your physician if vomiting continues.  Special Instructions/Symptoms: Your throat may feel dry or sore from the anesthesia or the breathing tube placed in your throat during surgery. If this causes discomfort, gargle with warm salt water. The discomfort should disappear within 24 hours.

## 2020-10-24 NOTE — Transfer of Care (Signed)
Immediate Anesthesia Transfer of Care Note  Patient: Kaylee Yang  Procedure(s) Performed: Procedure(s) (LRB): LEFT BREAST LUMPECTOMY (Left)  Patient Location: PACU  Anesthesia Type: General  Level of Consciousness: awake, sedated, patient cooperative and responds to stimulation  Airway & Oxygen Therapy: Patient Spontanous Breathing and Patient connected to [place] 02 and soft FM   Post-op Assessment: Report given to PACU RN, Post -op Vital signs reviewed and stable and Patient moving all extremities  Post vital signs: Reviewed and stable  Complications: No apparent anesthesia complications

## 2020-10-24 NOTE — Anesthesia Preprocedure Evaluation (Signed)
Anesthesia Evaluation  Patient identified by MRN, date of birth, ID band Patient awake    Reviewed: Allergy & Precautions, NPO status , Patient's Chart, lab work & pertinent test results  Airway Mallampati: II  TM Distance: >3 FB Neck ROM: Full    Dental no notable dental hx.    Pulmonary neg pulmonary ROS, Current Smoker,    Pulmonary exam normal breath sounds clear to auscultation       Cardiovascular hypertension, Normal cardiovascular exam Rhythm:Regular Rate:Normal     Neuro/Psych negative neurological ROS  negative psych ROS   GI/Hepatic negative GI ROS, Neg liver ROS,   Endo/Other  negative endocrine ROS  Renal/GU negative Renal ROS  negative genitourinary   Musculoskeletal negative musculoskeletal ROS (+)   Abdominal   Peds negative pediatric ROS (+)  Hematology negative hematology ROS (+)   Anesthesia Other Findings   Reproductive/Obstetrics negative OB ROS                             Anesthesia Physical Anesthesia Plan  ASA: 2  Anesthesia Plan: General   Post-op Pain Management:    Induction: Intravenous  PONV Risk Score and Plan: 2 and Ondansetron, Dexamethasone and Treatment may vary due to age or medical condition  Airway Management Planned: LMA  Additional Equipment:   Intra-op Plan:   Post-operative Plan: Extubation in OR  Informed Consent: I have reviewed the patients History and Physical, chart, labs and discussed the procedure including the risks, benefits and alternatives for the proposed anesthesia with the patient or authorized representative who has indicated his/her understanding and acceptance.     Dental advisory given  Plan Discussed with: CRNA and Surgeon  Anesthesia Plan Comments:         Anesthesia Quick Evaluation

## 2020-10-24 NOTE — Interval H&P Note (Signed)
History and Physical Interval Note:no change in H and P  10/24/2020 8:06 AM  Kaylee Yang  has presented today for surgery, with the diagnosis of LEFT BREAST PAPILLOMA.  The various methods of treatment have been discussed with the patient and family. After consideration of risks, benefits and other options for treatment, the patient has consented to  Procedure(s): LEFT BREAST LUMPECTOMY (Left) as a surgical intervention.  The patient's history has been reviewed, patient examined, no change in status, stable for surgery.  I have reviewed the patient's chart and labs.  Questions were answered to the patient's satisfaction.     Coralie Keens

## 2020-10-25 ENCOUNTER — Encounter (HOSPITAL_BASED_OUTPATIENT_CLINIC_OR_DEPARTMENT_OTHER): Payer: Self-pay | Admitting: Surgery

## 2020-10-25 LAB — SURGICAL PATHOLOGY

## 2022-01-28 IMAGING — US US BREAST*L* LIMITED INC AXILLA
1 series · 8 of 8 positions shown · non-contrast
Comparison: Previous exam(s).

CLINICAL DATA: 60-year-old female presenting for annual exam.
Patient feels a lump in the left breast is enlarging. This was last
evaluated in [id number] with recommendation for short-term follow-up.



[Series 1: us breast*left* limited inc axilla · 0.06mm/px · 8 of 8 slices shown]
[im 1/8]
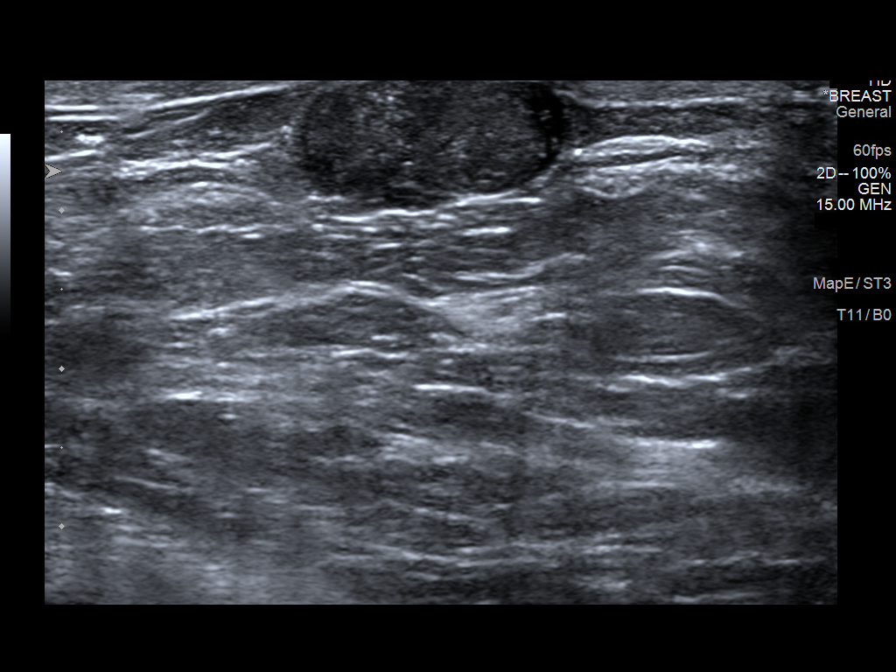
[im 2/8]
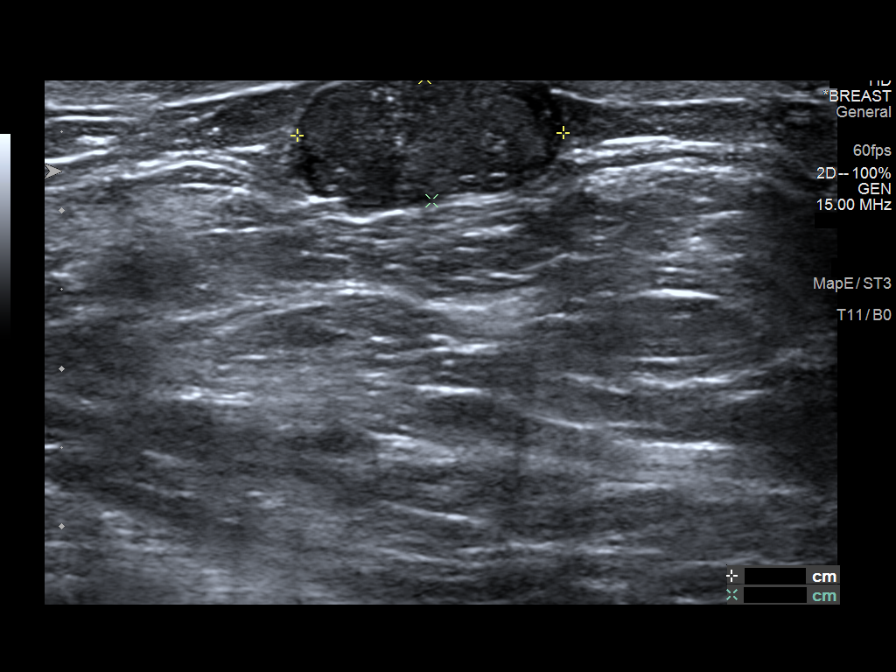
[im 3/8]
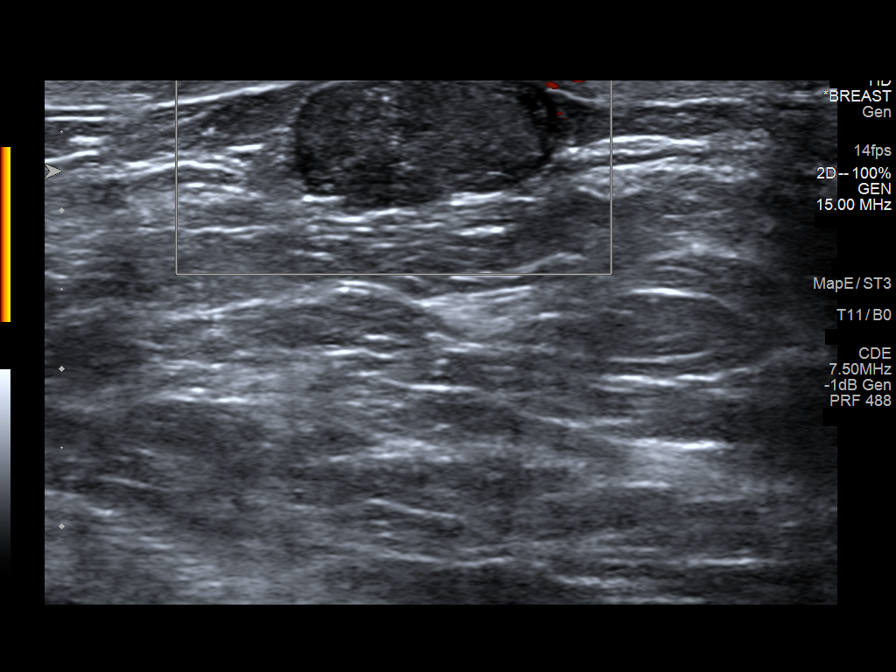
[im 4/8]
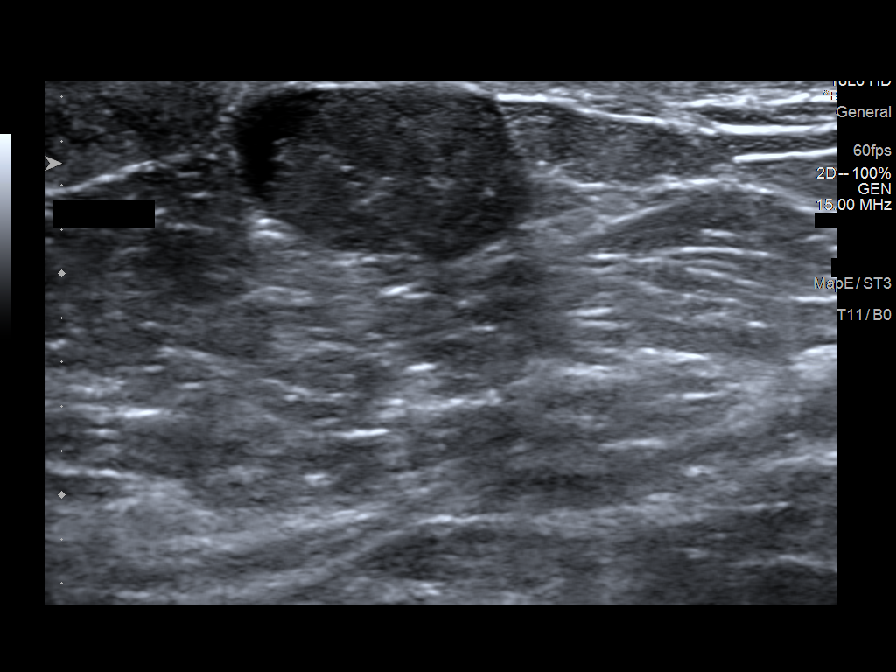
[im 5/8]
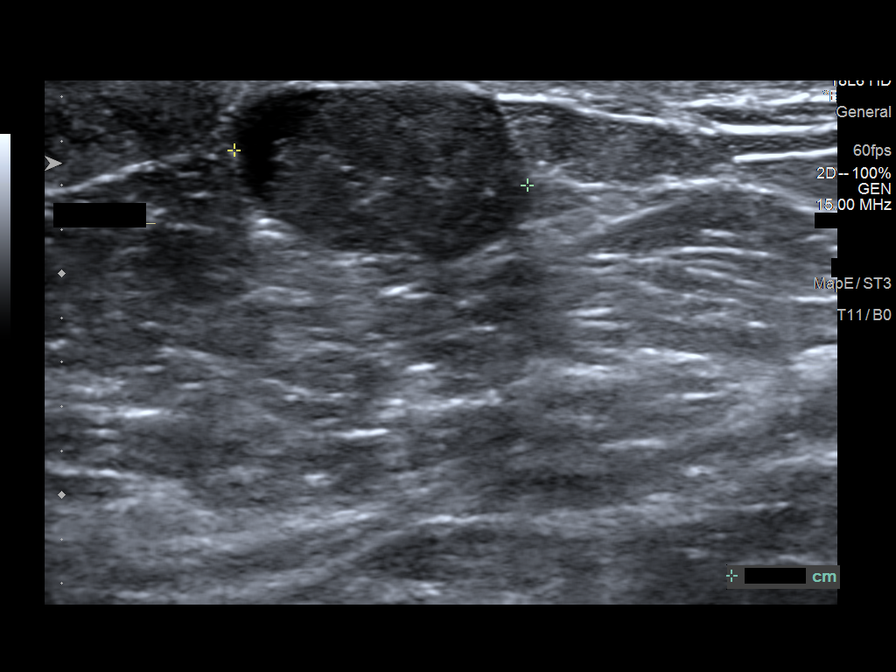
[im 6/8]
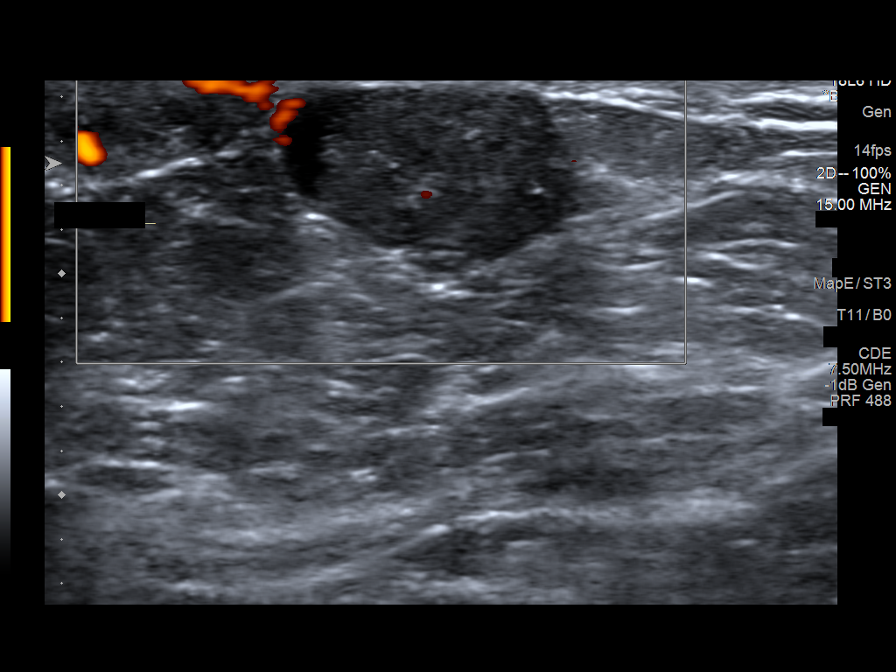
[im 7/8]
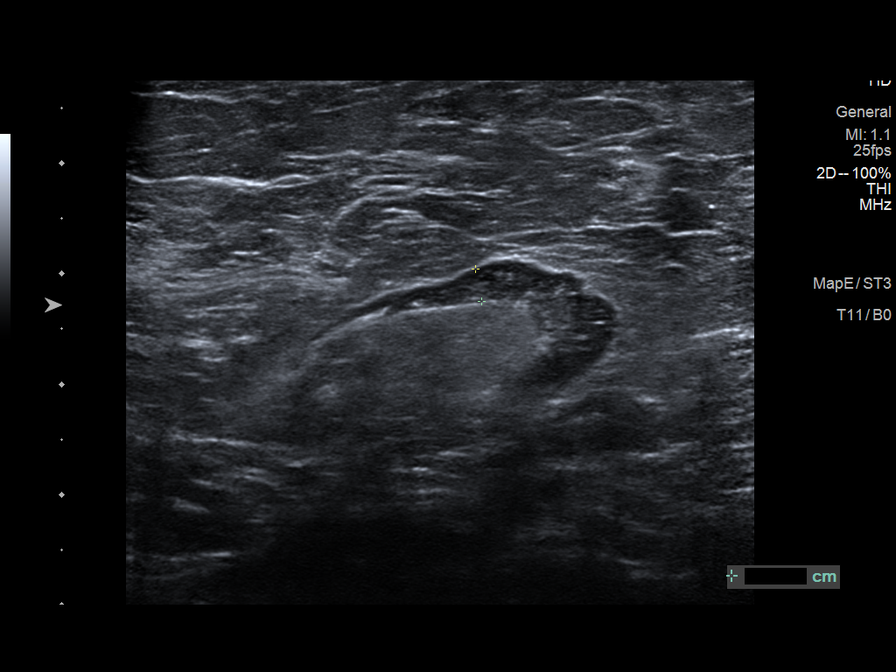
[im 8/8]
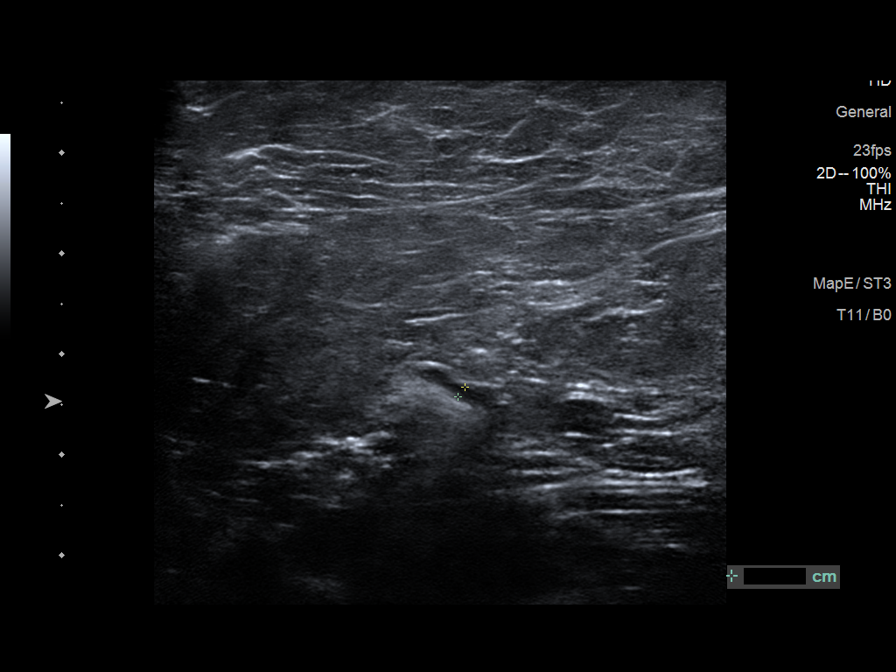

[8 of 8 positions shown; findings below may reference images not displayed]

ACR Breast Density Category c: The breast tissue is heterogeneously
dense, which may obscure small masses.
FINDINGS: Mammogram:

Right breast: Spot compression tomosynthesis MLO and full field mL
tomosynthesis views of the right breast were performed in addition
to standard views for a possible asymmetry in the inferior right
breast. The asymmetry persists on the true lateral view though is
less conspicuous on the spot imaging.

There is a possible prominent right axillary lymph node.

Left breast: A skin BB marks the palpable site of concern reported
by the patient in the medial subareolar breast. A spot tangential
view of this area is performed in addition to standard views. There
has been interval increase in size of the mass at the palpable site
now measuring approximately 1.7 cm.

On physical exam of the left breast at the palpable site of concern
I feel a superficial discrete mass.

Ultrasound:

Right breast: Targeted ultrasound is performed throughout the lower
inner quadrant of the right breast demonstrating no cystic or solid
mass or abnormal area of shadowing to correspond to the asymmetry
identified mammographically. Targeted ultrasound the right axilla
demonstrates normal lymph nodes.

Left breast: Targeted ultrasound is performed at the palpable site
in the left breast at 6 o'clock retroareolar demonstrating an oval
circumscribed hypoechoic mass just beneath the skin measuring 1.7 x
0.8 x 1.3 cm, previously measuring 1.5 x 0.6 x 1.1 cm, and prior to
that measured 1.1 x 0.5 x 1.2 cm. Targeted ultrasound the left
axilla demonstrates normal lymph nodes.
IMPRESSION: 1. Probably benign asymmetry without sonographic correlate in the
inferior right breast.

2. Indeterminate mass in the left breast at the palpable site which
has mildly increased in size.

RECOMMENDATION:
1. Ultrasound-guided core needle biopsy of the left breast mass at 6
o'clock retroareolar.

2. If the left breast biopsy returns with benign pathology recommend
diagnostic right breast mammogram in 6 months. If the biopsy returns
as atypia or malignancy consider stereotactic core needle biopsy of
the probably benign right breast asymmetry.

I have discussed the findings and recommendations with the patient.
If applicable, a reminder letter will be sent to the patient
regarding the next appointment.

BI-RADS CATEGORY  4: Suspicious.

## 2022-02-05 IMAGING — US US BREAST BX W LOC DEV 1ST LESION IMG BX SPEC US GUIDE*L*
1 series · 9 of 9 positions shown · non-contrast
Comparison: Previous exam(s).
COMPARISON: Previous exam(s).

Addendum:
CLINICAL DATA: Biopsy of a 6 o'clock retroareolar left breast mass

EXAM:
ULTRASOUND GUIDED LEFT BREAST CORE NEEDLE BIOPSY

[Series 1: us breast bx w loc dev 1st lesion img bx spec us g · 0.07mm/px · 9 of 9 slices shown]
[im 1/9]
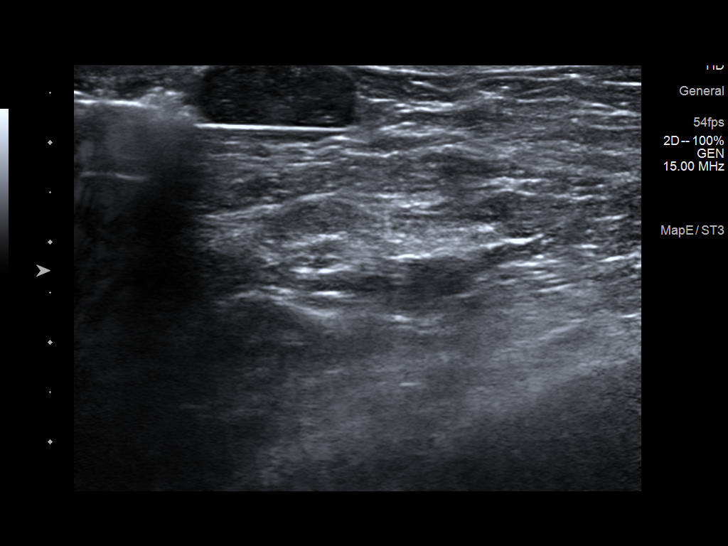
[im 2/9]
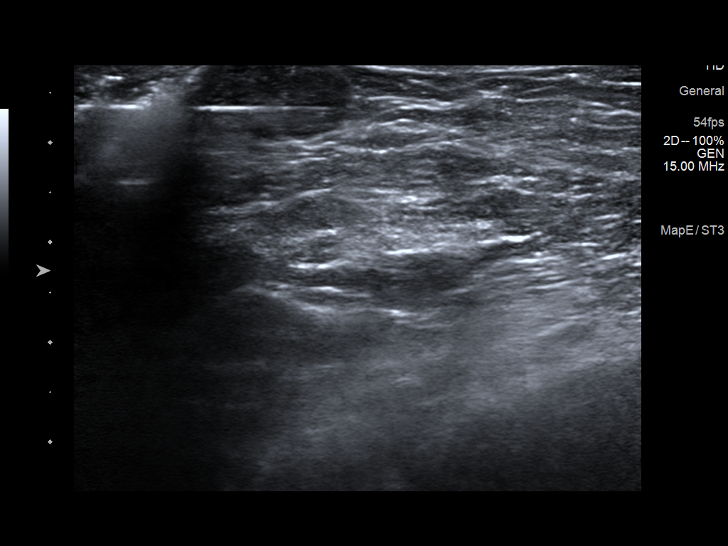
[im 3/9]
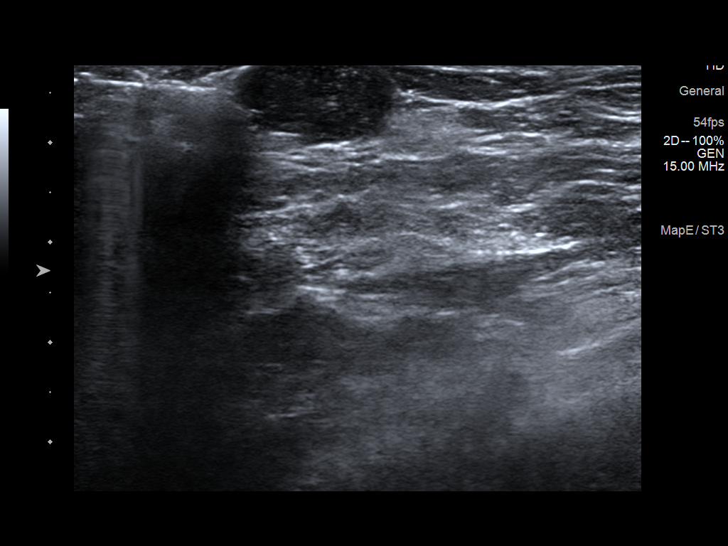
[im 4/9]
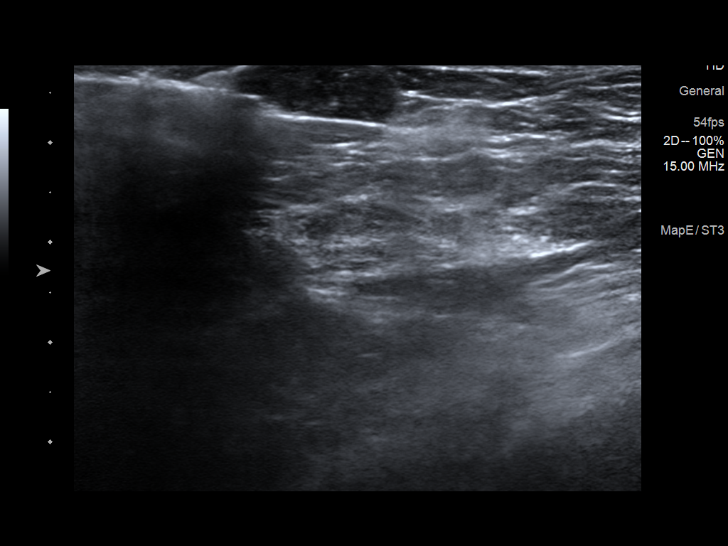
[im 5/9]
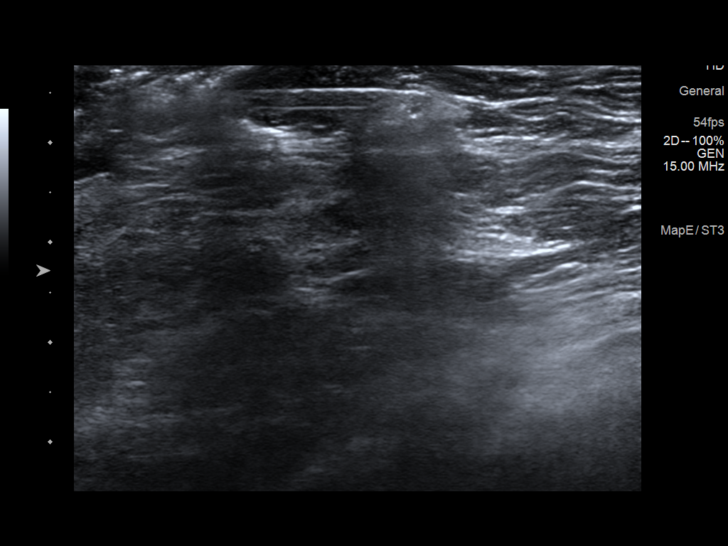
[im 6/9]
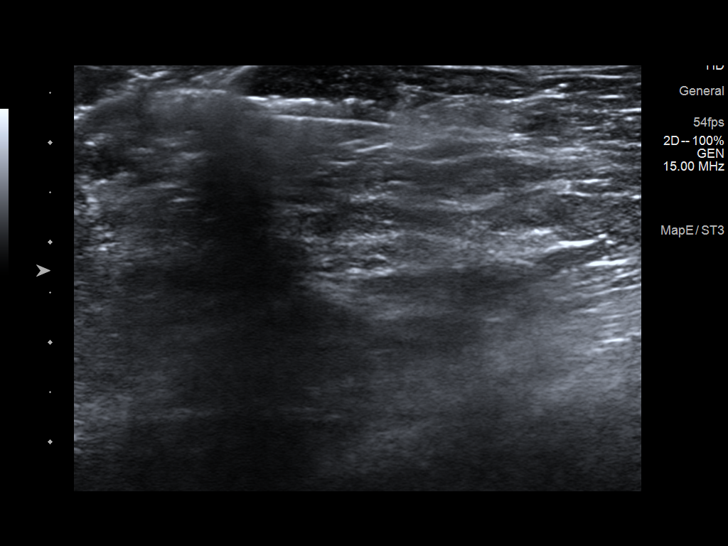
[im 7/9]
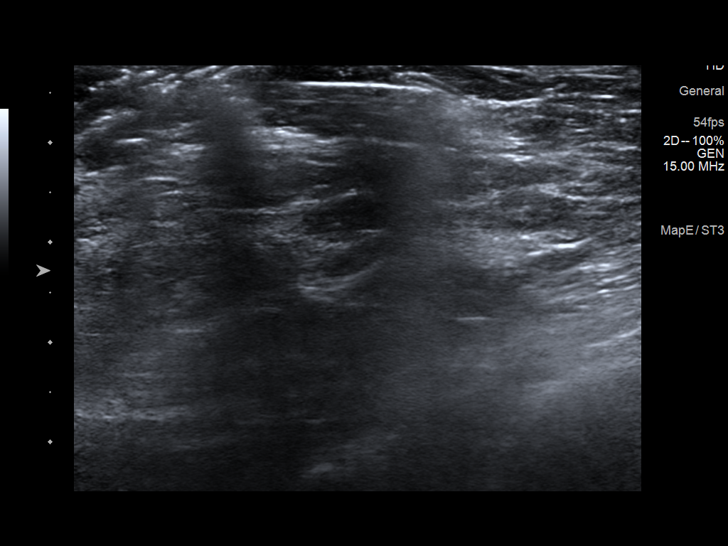
[im 8/9]
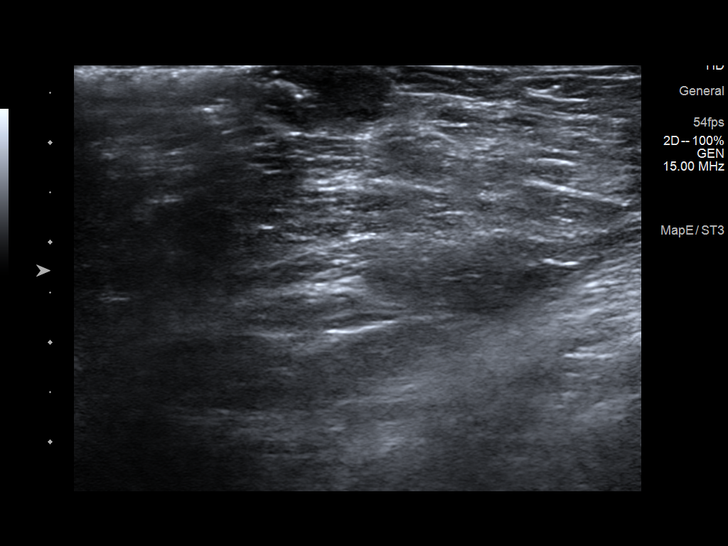
[im 9/9]
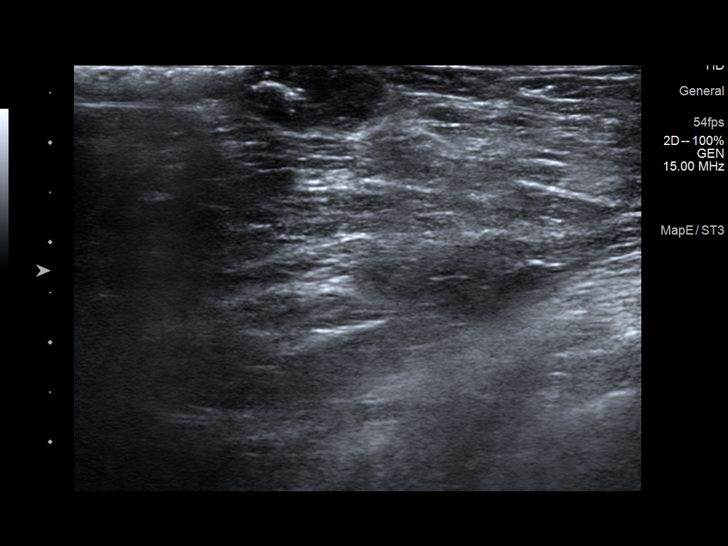

[9 of 9 positions shown; findings below may reference images not displayed]



Lesion quadrant: 6 o'clock left retroareolar

Using sterile technique and 1% Lidocaine as local anesthetic, under
direct ultrasound visualization, a 12 gauge Zq device was
used to perform biopsy of a 6 o'clock left breast mass using a
lateral approach. At the conclusion of the procedure a ribbon shaped
tissue marker clip was deployed into the biopsy cavity. Follow up 2
view mammogram was performed and dictated separately.
IMPRESSION: Ultrasound guided biopsy of a 6 o'clock left breast mass. No
apparent complications.

ADDENDUM:
Pathology revealed INTRADUCTAL PAPILLOMA WITH USUAL DUCTAL
HYPERPLASIA of the LEFT breast, 6 o'clock, (ribbon clip). This was
found to be concordant by Dr. Ingunn Harpa Ronlor, with surgical
consultation for excision recommended, per [[facility]] Breast Working
Group protocol.

Pathology results were discussed with the patient by telephone. The
patient reported doing well after the biopsy with minimal tenderness
at the site. Post biopsy instructions and care were reviewed, and
questions were answered. The patient was encouraged to call The
direct phone number was provided.

Surgical consultation has been arranged with Dr. Aidee Cela at
[[facility]] on September 27, 2020.

The patient is scheduled for a RIGHT breast stereotatic guided
biopsy on September 14, 2020. Further recommendations will be guided by
the results of this biopsy.

Pathology results reported by Tanu Oxendine, RN on 09/07/2020.



Lesion quadrant: 6 o'clock left retroareolar

Using sterile technique and 1% Lidocaine as local anesthetic, under
direct ultrasound visualization, a 12 gauge Zq device was
used to perform biopsy of a 6 o'clock left breast mass using a
lateral approach. At the conclusion of the procedure a ribbon shaped
tissue marker clip was deployed into the biopsy cavity. Follow up 2
view mammogram was performed and dictated separately.
IMPRESSION: Ultrasound guided biopsy of a 6 o'clock left breast mass. No
apparent complications.

## 2022-02-13 IMAGING — MG MM BREAST LOCALIZATION CLIP
4 series · 4 of 12 positions shown · non-contrast
Comparison: Previous exam(s).

CLINICAL DATA: Confirmation of clip placement after stereotactic
tomosynthesis core needle biopsy an indeterminate asymmetry in the
lower RIGHT breast.

EXAM:
2D and 3D DIAGNOSTIC RIGHT MAMMOGRAM POST STEREOTACTIC TOMOSYNTHESIS
BIOPSY

[R CC synth-2D]
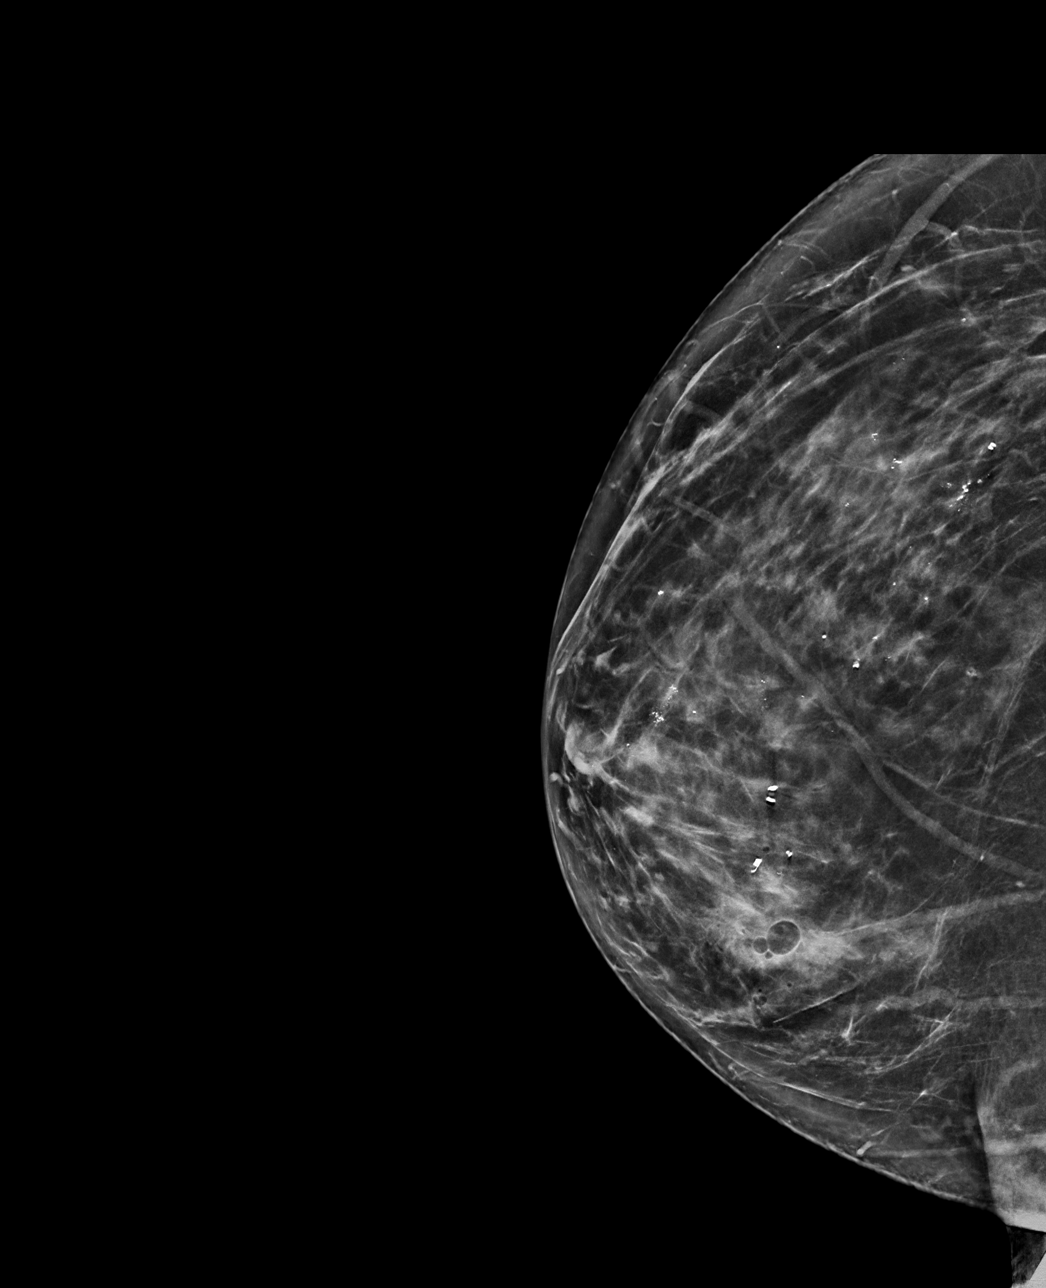

[R LM synth-2D]
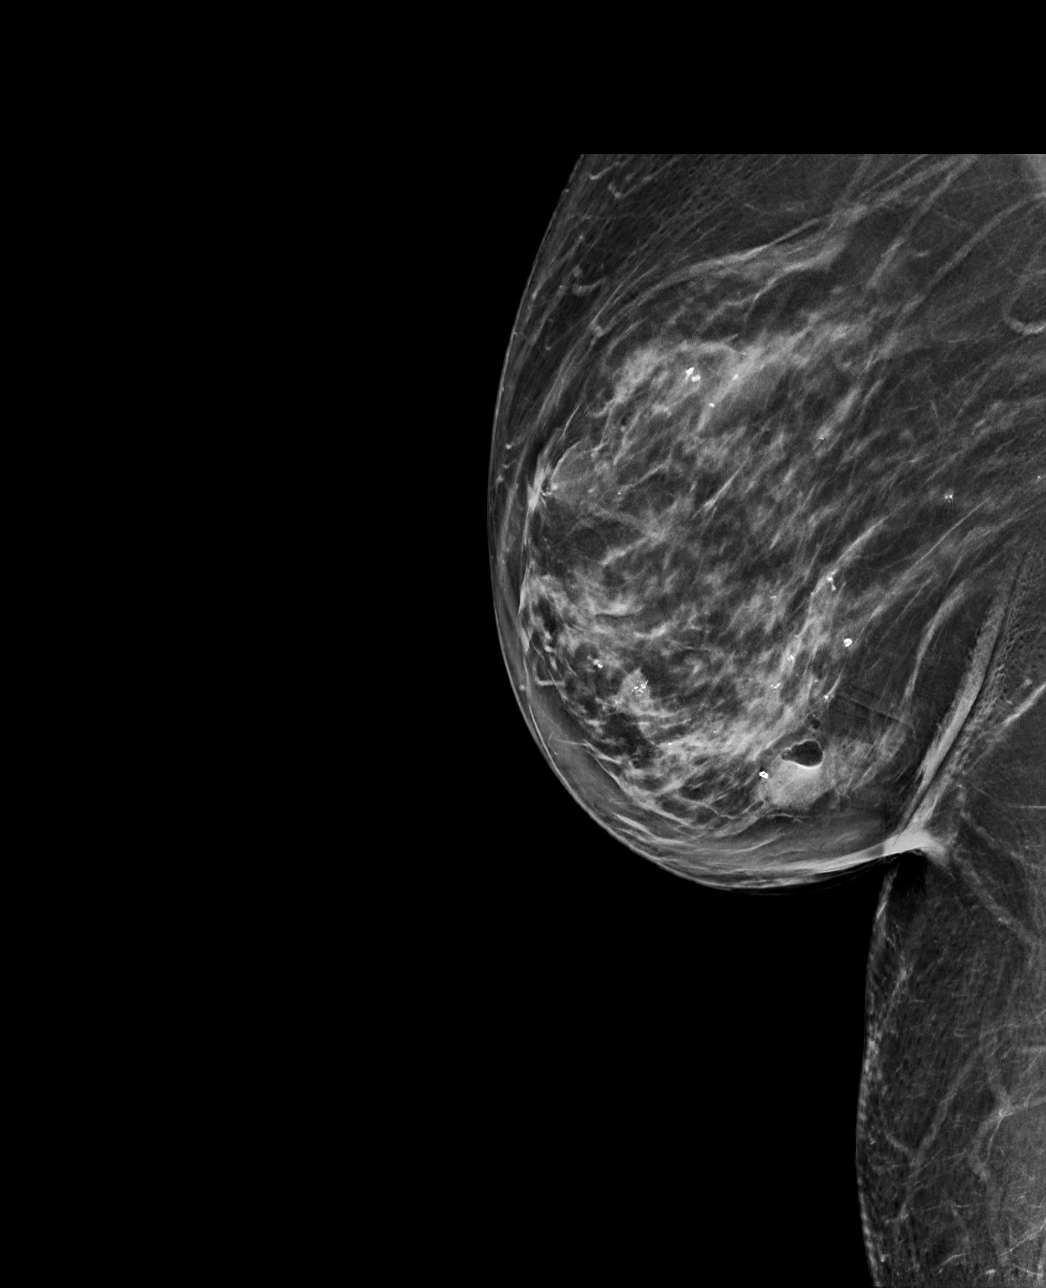

[R LM tomo · tomo slice 49/98.0]
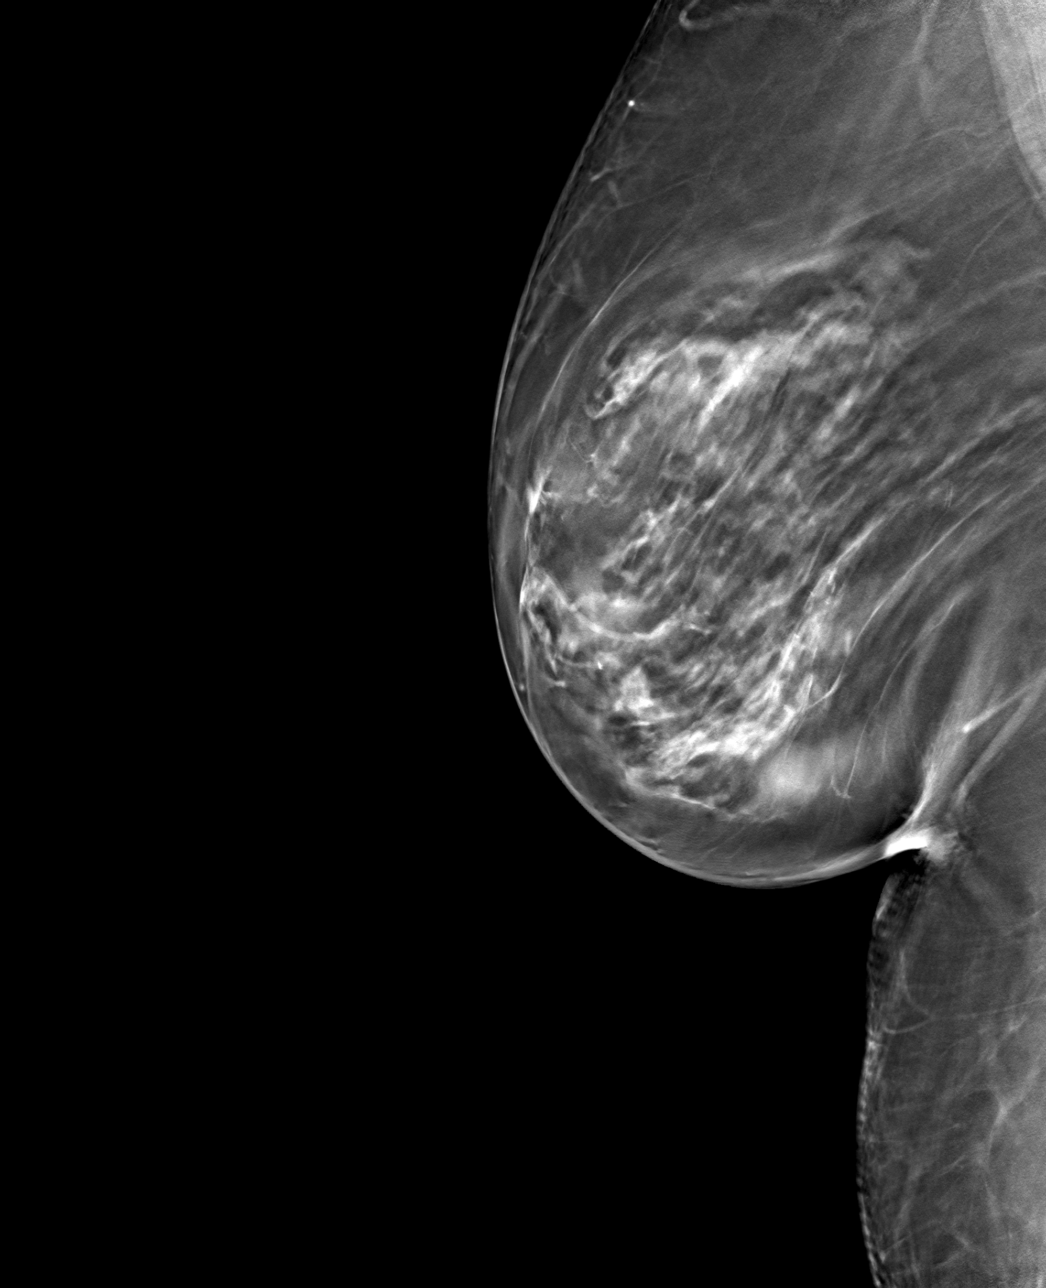

[R CC tomo · tomo slice 39/76.0]
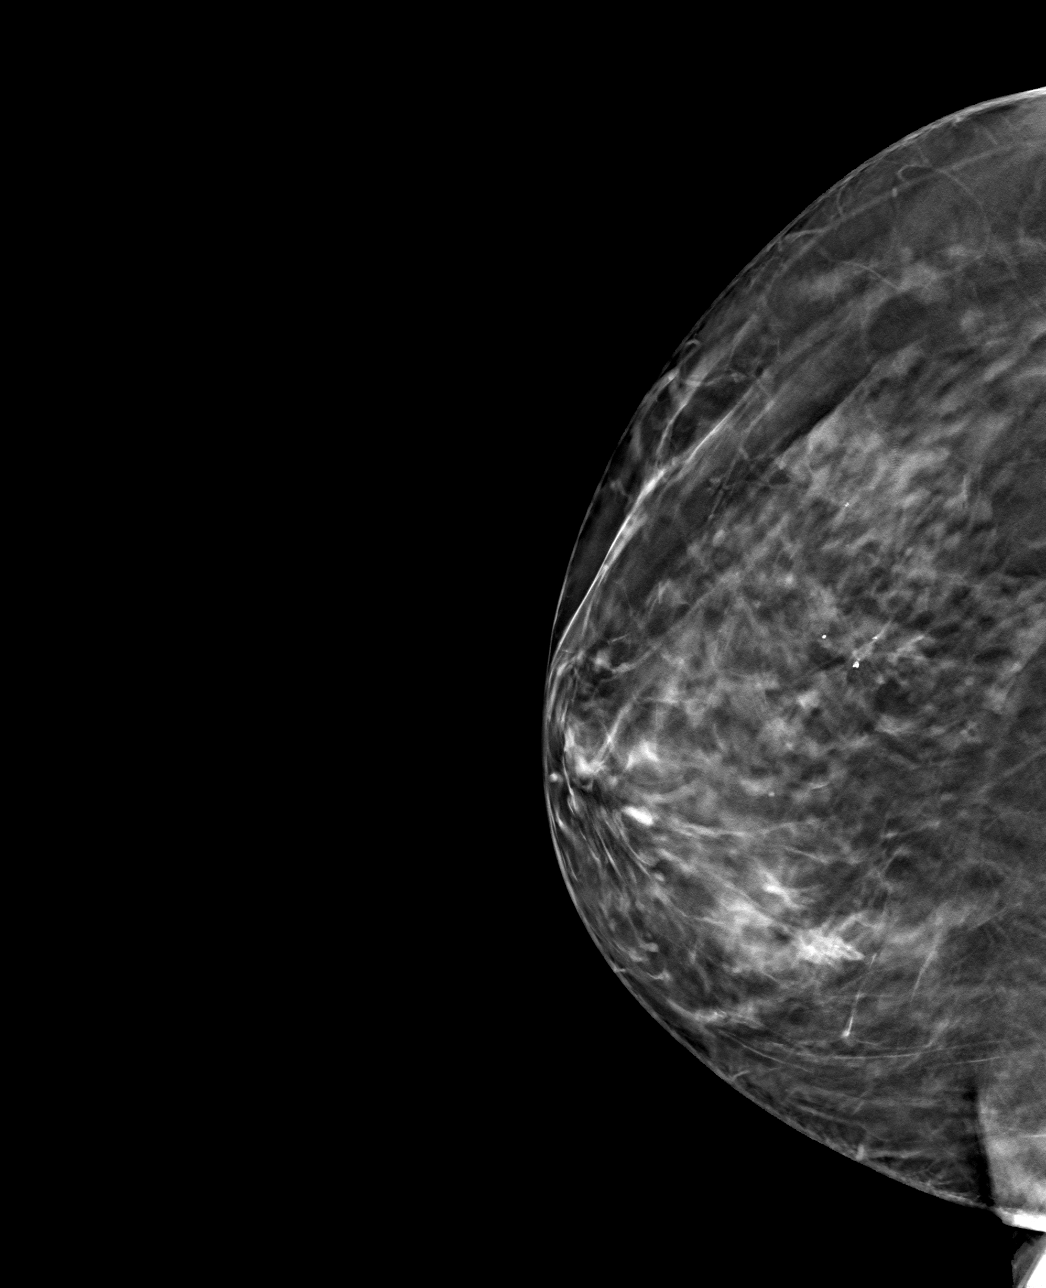

[4 of 12 positions shown; findings below may reference images not displayed]

FINDINGS: Tomosynthesis and synthesized full field CC and mediolateral images
were obtained following stereotactic tomosynthesis guided biopsy of
an asymmetry involving the lower RIGHT breast. The coil shaped
tissue marking clip is appropriately positioned at the inferior
margin of the biopsied asymmetry.

Expected post biopsy changes are present without evidence of
hematoma.
IMPRESSION: Appropriate positioning of the coil shaped biopsy marking clip at
the site of the biopsied asymmetry in the lower RIGHT breast, LOWER
INNER QUADRANT.

Final Assessment: Post Procedure Mammograms for Marker Placement
